# Patient Record
Sex: Female | Born: 1995 | Race: White | Hispanic: No | Marital: Single | Smoking: Never smoker
Health system: Southern US, Community
[De-identification: ages and names within clinical notes are randomized; demographics above are authoritative.]

## PROBLEM LIST (undated history)

## (undated) DIAGNOSIS — F32A Depression, unspecified: Secondary | ICD-10-CM

## (undated) DIAGNOSIS — R160 Hepatomegaly, not elsewhere classified: Secondary | ICD-10-CM

## (undated) DIAGNOSIS — H6641 Suppurative otitis media, unspecified, right ear: Secondary | ICD-10-CM

## (undated) DIAGNOSIS — M940 Chondrocostal junction syndrome [Tietze]: Secondary | ICD-10-CM

## (undated) DIAGNOSIS — J45909 Unspecified asthma, uncomplicated: Secondary | ICD-10-CM

## (undated) DIAGNOSIS — F419 Anxiety disorder, unspecified: Secondary | ICD-10-CM

## (undated) DIAGNOSIS — R51 Headache: Secondary | ICD-10-CM

## (undated) DIAGNOSIS — Z820 Family history of epilepsy and other diseases of the nervous system: Secondary | ICD-10-CM

## (undated) DIAGNOSIS — R519 Headache, unspecified: Secondary | ICD-10-CM

## (undated) DIAGNOSIS — N390 Urinary tract infection, site not specified: Secondary | ICD-10-CM

## (undated) DIAGNOSIS — B2790 Infectious mononucleosis, unspecified without complication: Secondary | ICD-10-CM

## (undated) DIAGNOSIS — N200 Calculus of kidney: Secondary | ICD-10-CM

## (undated) DIAGNOSIS — R109 Unspecified abdominal pain: Secondary | ICD-10-CM

## (undated) DIAGNOSIS — L309 Dermatitis, unspecified: Secondary | ICD-10-CM

## (undated) DIAGNOSIS — R634 Abnormal weight loss: Secondary | ICD-10-CM

## (undated) DIAGNOSIS — F329 Major depressive disorder, single episode, unspecified: Secondary | ICD-10-CM

## (undated) DIAGNOSIS — K5909 Other constipation: Secondary | ICD-10-CM

## (undated) HISTORY — DX: Other constipation: K59.09

## (undated) HISTORY — DX: Dermatitis, unspecified: L30.9

## (undated) HISTORY — DX: Major depressive disorder, single episode, unspecified: F32.9

## (undated) HISTORY — DX: Suppurative otitis media, unspecified, right ear: H66.41

## (undated) HISTORY — DX: Anxiety disorder, unspecified: F41.9

## (undated) HISTORY — DX: Unspecified abdominal pain: R10.9

## (undated) HISTORY — DX: Other disorders of bilirubin metabolism: E80.6

## (undated) HISTORY — DX: Calculus of kidney: N20.0

## (undated) HISTORY — DX: Urinary tract infection, site not specified: N39.0

## (undated) HISTORY — PX: EYE SURGERY: SHX253

## (undated) HISTORY — DX: Chondrocostal junction syndrome (tietze): M94.0

## (undated) HISTORY — DX: Abnormal weight loss: R63.4

## (undated) HISTORY — DX: Infectious mononucleosis, unspecified without complication: B27.90

## (undated) HISTORY — DX: Depression, unspecified: F32.A

## (undated) HISTORY — DX: Hepatomegaly, not elsewhere classified: R16.0

---
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
## 2015-07-26 ENCOUNTER — Encounter (HOSPITAL_COMMUNITY): Payer: Self-pay | Admitting: Emergency Medicine

## 2015-07-26 ENCOUNTER — Emergency Department (HOSPITAL_COMMUNITY)
Admission: EM | Admit: 2015-07-26 | Discharge: 2015-07-26 | Disposition: A | Discharge status: Home / Self Care | Payer: No Typology Code available for payment source | Attending: Emergency Medicine | Admitting: Emergency Medicine

## 2015-07-26 DIAGNOSIS — R112 Nausea with vomiting, unspecified: Secondary | ICD-10-CM | POA: Insufficient documentation

## 2015-07-26 DIAGNOSIS — R1030 Lower abdominal pain, unspecified: Secondary | ICD-10-CM | POA: Insufficient documentation

## 2015-07-26 DIAGNOSIS — R197 Diarrhea, unspecified: Secondary | ICD-10-CM | POA: Insufficient documentation

## 2015-07-26 NOTE — ED Notes (Signed)
Patient here with complaints of lower abd pain, nausea/vomiting, diarrhea. Hx of chronic constipation.

## 2015-11-03 ENCOUNTER — Encounter: Payer: Self-pay | Admitting: Pediatrics

## 2015-11-04 ENCOUNTER — Encounter: Payer: Self-pay | Admitting: Pediatrics

## 2016-05-29 IMAGING — CR DG NECK SOFT TISSUE
2 series · 2 of 2 positions shown · non-contrast
Comparison: Chest 05/12/2011

CLINICAL DATA: Sore throat and fever for 24 hr.

EXAM:
NECK SOFT TISSUES - 1+ VIEW

[w soft tissue neck lat]
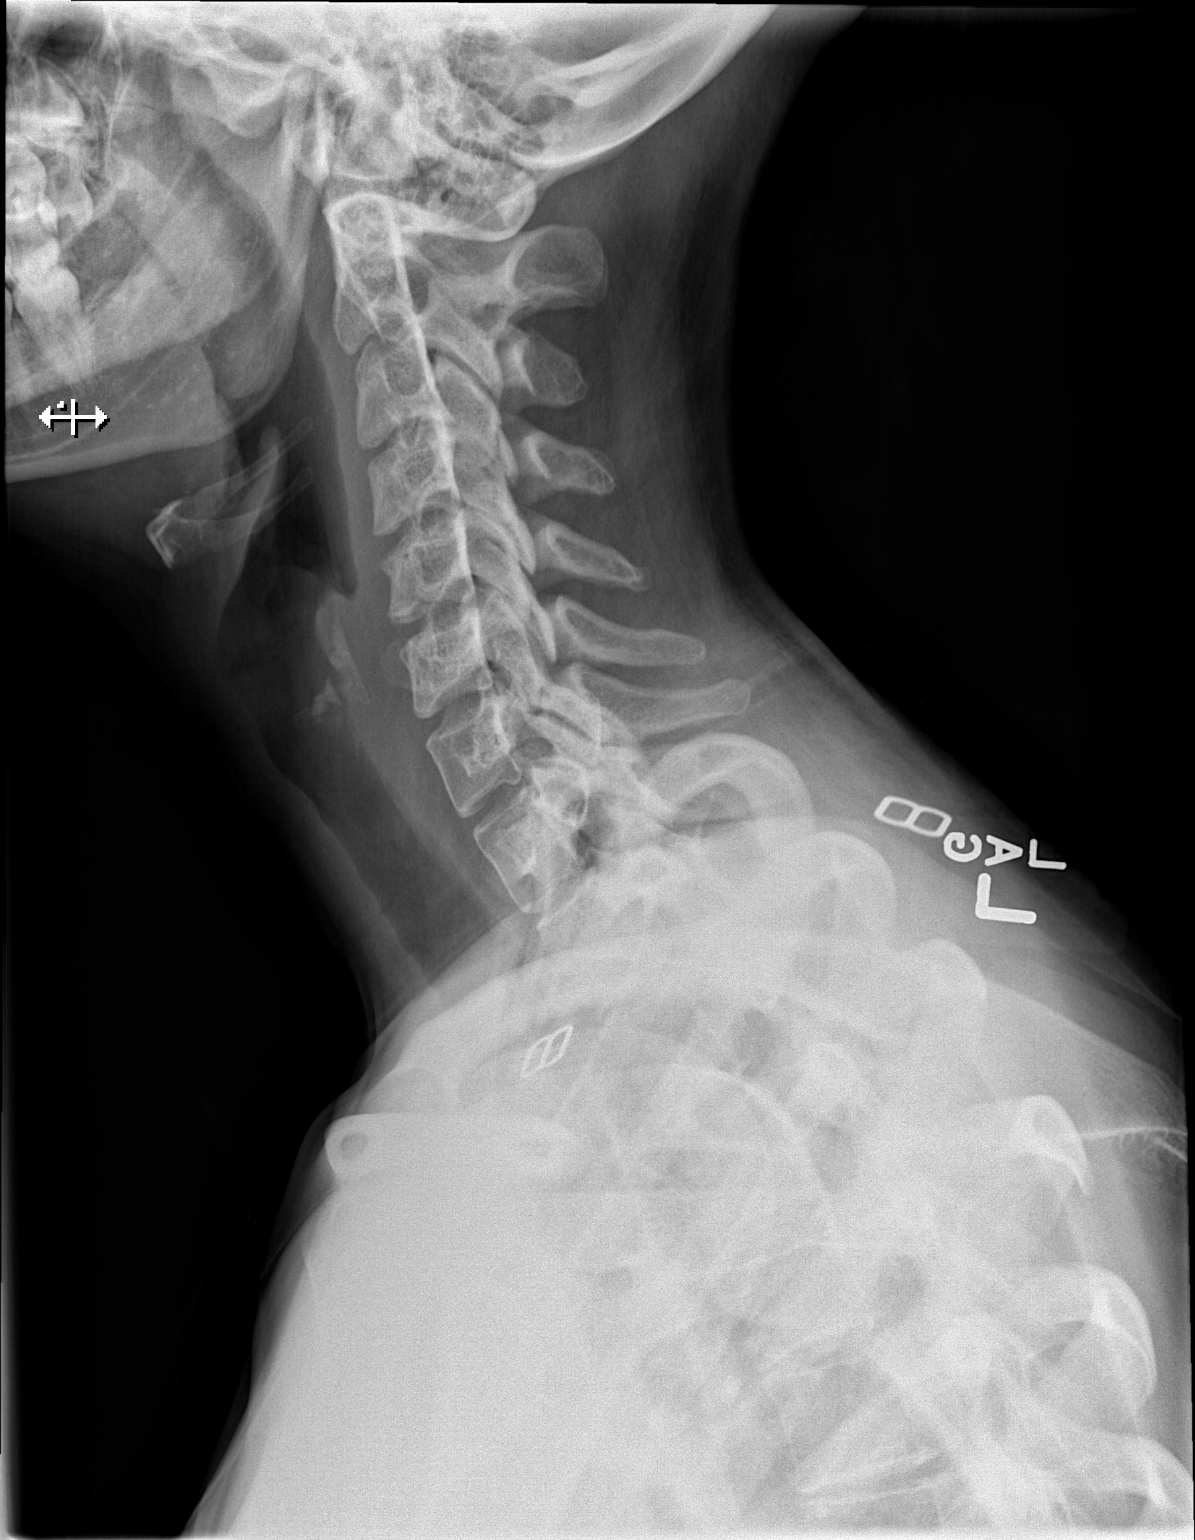

[w soft tissue neck ap]
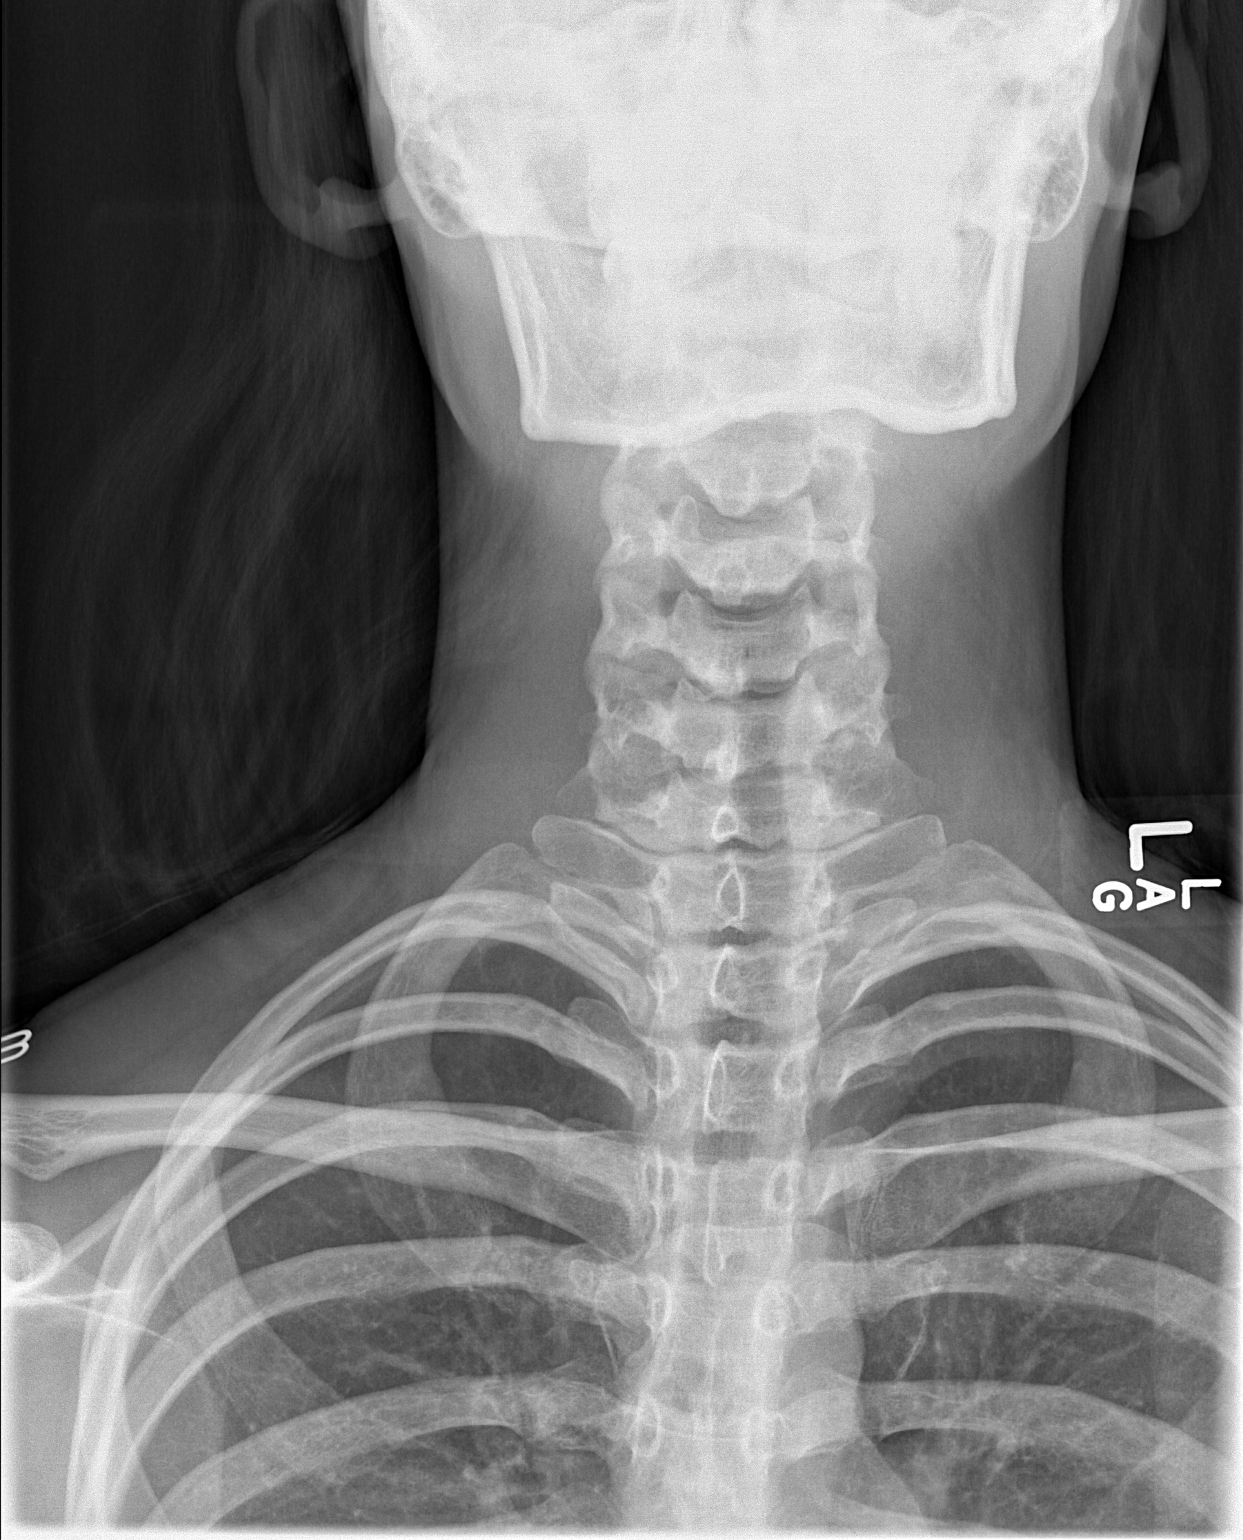

[2 of 2 positions shown; findings below may reference images not displayed]

FINDINGS: There is no evidence of retropharyngeal soft tissue swelling or
epiglottic enlargement. The cervical airway is unremarkable and no
radio-opaque foreign body identified.
IMPRESSION: Negative.

## 2019-04-11 NOTE — L&D Delivery Note (Signed)
Operative Delivery Note At 2:55 AM a viable female was delivered via Vaginal, [employer], operational).  Presentation: vertex; Position: Right,, Occiput,, Anterior; Station: +2.  Verbal consent: obtained from patient.  Risks and benefits discussed in detail.  Risks include, but are not limited to the risks of anesthesia, bleeding, infection, damage to maternal tissues, fetal cephalhematoma.  There is also the risk of inability to effect vaginal delivery of the head, or shoulder dystocia that cannot be resolved by established maneuvers, leading to the need for emergency cesarean section.  APGAR: 8, 9; weight  pending.   Placenta status: spontaneously with 3 vessel cord , .   Cord:  with the following complications:loose nuchal cord  .  Cord pH: not obtained  Anesthesia:  epidural Instruments: mushroom Episiotomy: None Lacerations: 2nd degree Suture Repair: 3.0 chromic Est. Blood Loss (mL):  300  Mom to postpartum.  Baby to Couplet care / Skin to Skin.  Jeani Hawking 01/21/2020, 3:09 AM

## 2019-09-23 LAB — OB RESULTS CONSOLE RUBELLA ANTIBODY, IGM: Rubella: IMMUNE

## 2019-09-23 LAB — OB RESULTS CONSOLE ANTIBODY SCREEN: Antibody Screen: NEGATIVE

## 2019-09-23 LAB — OB RESULTS CONSOLE GC/CHLAMYDIA
Chlamydia: NEGATIVE
Gonorrhea: NEGATIVE

## 2019-09-23 LAB — OB RESULTS CONSOLE ABO/RH: RH Type: POSITIVE

## 2019-09-23 LAB — OB RESULTS CONSOLE HIV ANTIBODY (ROUTINE TESTING): HIV: NONREACTIVE

## 2019-09-23 LAB — OB RESULTS CONSOLE HEPATITIS B SURFACE ANTIGEN: Hepatitis B Surface Ag: NEGATIVE

## 2019-12-31 LAB — OB RESULTS CONSOLE GBS: GBS: NEGATIVE

## 2020-01-14 ENCOUNTER — Ambulatory Visit (INDEPENDENT_AMBULATORY_CARE_PROVIDER_SITE_OTHER): Payer: Self-pay | Admitting: Pediatrics

## 2020-01-14 ENCOUNTER — Other Ambulatory Visit: Payer: Self-pay

## 2020-01-14 DIAGNOSIS — Z7681 Expectant parent(s) prebirth pediatrician visit: Secondary | ICD-10-CM

## 2020-01-15 ENCOUNTER — Encounter (HOSPITAL_COMMUNITY): Payer: Self-pay | Admitting: *Deleted

## 2020-01-15 ENCOUNTER — Telehealth (HOSPITAL_COMMUNITY): Payer: Self-pay | Admitting: *Deleted

## 2020-01-15 NOTE — Telephone Encounter (Signed)
Preadmission screen  

## 2020-01-17 ENCOUNTER — Other Ambulatory Visit (HOSPITAL_COMMUNITY)
Admission: RE | Admit: 2020-01-17 | Discharge: 2020-01-17 | Disposition: A | Discharge status: Home / Self Care | Payer: BC Managed Care – PPO | Source: Ambulatory Visit | Attending: Obstetrics and Gynecology | Admitting: Obstetrics and Gynecology

## 2020-01-17 DIAGNOSIS — Z01812 Encounter for preprocedural laboratory examination: Secondary | ICD-10-CM | POA: Insufficient documentation

## 2020-01-17 DIAGNOSIS — Z20822 Contact with and (suspected) exposure to covid-19: Secondary | ICD-10-CM | POA: Insufficient documentation

## 2020-01-17 LAB — SARS CORONAVIRUS 2 (TAT 6-24 HRS): SARS Coronavirus 2: NEGATIVE

## 2020-01-18 NOTE — Progress Notes (Signed)
Prenatal counseling for impending newborn done--  Reviewed current vaccine policy and answered all questions.  1st child, Currently 37 weeks, Current complications:  none, Z76.81

## 2020-01-20 ENCOUNTER — Other Ambulatory Visit: Payer: Self-pay

## 2020-01-20 ENCOUNTER — Inpatient Hospital Stay (HOSPITAL_COMMUNITY): Payer: BC Managed Care – PPO | Admitting: Anesthesiology

## 2020-01-20 ENCOUNTER — Inpatient Hospital Stay (HOSPITAL_COMMUNITY)
Admission: AD | Admit: 2020-01-20 | Discharge: 2020-01-23 | DRG: 807 | Disposition: A | Discharge status: Home / Self Care | Payer: BC Managed Care – PPO | Attending: Obstetrics and Gynecology | Admitting: Obstetrics and Gynecology

## 2020-01-20 ENCOUNTER — Encounter (HOSPITAL_COMMUNITY): Payer: Self-pay | Admitting: Obstetrics and Gynecology

## 2020-01-20 ENCOUNTER — Inpatient Hospital Stay (HOSPITAL_COMMUNITY): Payer: BC Managed Care – PPO

## 2020-01-20 DIAGNOSIS — Z3490 Encounter for supervision of normal pregnancy, unspecified, unspecified trimester: Secondary | ICD-10-CM

## 2020-01-20 DIAGNOSIS — Z3A39 39 weeks gestation of pregnancy: Secondary | ICD-10-CM | POA: Diagnosis not present

## 2020-01-20 DIAGNOSIS — Z01818 Encounter for other preprocedural examination: Secondary | ICD-10-CM | POA: Diagnosis not present

## 2020-01-20 DIAGNOSIS — Z23 Encounter for immunization: Secondary | ICD-10-CM | POA: Diagnosis not present

## 2020-01-20 DIAGNOSIS — O26893 Other specified pregnancy related conditions, third trimester: Secondary | ICD-10-CM | POA: Diagnosis present

## 2020-01-20 DIAGNOSIS — Z20822 Contact with and (suspected) exposure to covid-19: Secondary | ICD-10-CM | POA: Diagnosis present

## 2020-01-20 HISTORY — DX: Unspecified asthma, uncomplicated: J45.909

## 2020-01-20 LAB — CBC
HCT: 41.6 % (ref 36.0–46.0)
Hemoglobin: 14.4 g/dL (ref 12.0–15.0)
MCH: 31.8 pg (ref 26.0–34.0)
MCHC: 34.6 g/dL (ref 30.0–36.0)
MCV: 91.8 fL (ref 80.0–100.0)
Platelets: 150 10*3/uL (ref 150–400)
RBC: 4.53 MIL/uL (ref 3.87–5.11)
RDW: 12.4 % (ref 11.5–15.5)
WBC: 6 10*3/uL (ref 4.0–10.5)
nRBC: 0 % (ref 0.0–0.2)

## 2020-01-20 LAB — TYPE AND SCREEN
ABO/RH(D): B POS
Antibody Screen: NEGATIVE

## 2020-01-20 LAB — RPR: RPR Ser Ql: NONREACTIVE

## 2020-01-20 MED ORDER — OXYTOCIN-SODIUM CHLORIDE 30-0.9 UT/500ML-% IV SOLN
2.5000 [IU]/h | INTRAVENOUS | Status: DC
  Administered 2020-01-21: 2.5 [IU]/h via INTRAVENOUS
  Filled 2020-01-20: qty 500

## 2020-01-20 MED ORDER — PHENYLEPHRINE 40 MCG/ML (10ML) SYRINGE FOR IV PUSH (FOR BLOOD PRESSURE SUPPORT): 80.0000 ug | PREFILLED_SYRINGE | INTRAVENOUS | Status: DC | PRN

## 2020-01-20 MED ORDER — FENTANYL-BUPIVACAINE-NACL 0.5-0.125-0.9 MG/250ML-% EP SOLN
12.0000 mL/h | EPIDURAL | Status: DC | PRN
  Filled 2020-01-20: qty 250

## 2020-01-20 MED ORDER — LACTATED RINGERS IV SOLN
500.0000 mL | Freq: Once | INTRAVENOUS | Status: AC
  Administered 2020-01-20: 500 mL via INTRAVENOUS

## 2020-01-20 MED ORDER — DIPHENHYDRAMINE HCL 50 MG/ML IJ SOLN: 12.5000 mg | INTRAMUSCULAR | Status: DC | PRN

## 2020-01-20 MED ORDER — OXYTOCIN-SODIUM CHLORIDE 30-0.9 UT/500ML-% IV SOLN
1.0000 m[IU]/min | INTRAVENOUS | Status: DC
  Administered 2020-01-20: 2 m[IU]/min via INTRAVENOUS
  Filled 2020-01-20: qty 500

## 2020-01-20 MED ORDER — OXYCODONE-ACETAMINOPHEN 5-325 MG PO TABS: 1.0000 | ORAL_TABLET | ORAL | Status: DC | PRN

## 2020-01-20 MED ORDER — EPHEDRINE 5 MG/ML INJ: 10.0000 mg | INTRAVENOUS | Status: DC | PRN

## 2020-01-20 MED ORDER — LIDOCAINE HCL (PF) 1 % IJ SOLN
INTRAMUSCULAR | Status: DC | PRN
  Administered 2020-01-20: 6 mL via EPIDURAL

## 2020-01-20 MED ORDER — ONDANSETRON HCL 4 MG/2ML IJ SOLN: 4.0000 mg | Freq: Four times a day (QID) | INTRAMUSCULAR | Status: DC | PRN

## 2020-01-20 MED ORDER — LACTATED RINGERS IV SOLN: INTRAVENOUS | Status: DC

## 2020-01-20 MED ORDER — MISOPROSTOL 25 MCG QUARTER TABLET: 25.0000 ug | ORAL_TABLET | ORAL | Status: DC | PRN

## 2020-01-20 MED ORDER — LACTATED RINGERS IV SOLN: 500.0000 mL | INTRAVENOUS | Status: DC | PRN

## 2020-01-20 MED ORDER — OXYCODONE-ACETAMINOPHEN 5-325 MG PO TABS: 2.0000 | ORAL_TABLET | ORAL | Status: DC | PRN

## 2020-01-20 MED ORDER — FENTANYL-BUPIVACAINE-NACL 0.5-0.125-0.9 MG/250ML-% EP SOLN: 12.0000 mL/h | EPIDURAL | Status: DC | PRN

## 2020-01-20 MED ORDER — TERBUTALINE SULFATE 1 MG/ML IJ SOLN: 0.2500 mg | Freq: Once | INTRAMUSCULAR | Status: DC | PRN

## 2020-01-20 MED ORDER — BUPIVACAINE HCL (PF) 0.75 % IJ SOLN
INTRAMUSCULAR | Status: DC | PRN
Start: 2020-01-20 — End: 2020-01-21
  Administered 2020-01-20: 12 mL/h via EPIDURAL

## 2020-01-20 MED ORDER — OXYTOCIN BOLUS FROM INFUSION
333.0000 mL | Freq: Once | INTRAVENOUS | Status: AC
  Administered 2020-01-21: 333 mL via INTRAVENOUS

## 2020-01-20 MED ORDER — LIDOCAINE HCL (PF) 1 % IJ SOLN: 30.0000 mL | INTRAMUSCULAR | Status: DC | PRN

## 2020-01-20 MED ORDER — SOD CITRATE-CITRIC ACID 500-334 MG/5ML PO SOLN: 30.0000 mL | ORAL | Status: DC | PRN

## 2020-01-20 MED ORDER — ACETAMINOPHEN 325 MG PO TABS: 650.0000 mg | ORAL_TABLET | ORAL | Status: DC | PRN

## 2020-01-20 NOTE — Anesthesia Preprocedure Evaluation (Signed)
Anesthesia Evaluation  Patient identified by MRN, date of birth, ID band Patient awake    Reviewed: Allergy & Precautions, H&P , NPO status , Patient's Chart, lab work & pertinent test results, reviewed documented beta blocker date and time   Airway Mallampati: II  TM Distance: >3 FB Neck ROM: full    Dental no notable dental hx. (+) Teeth Intact, Dental Advisory Given   Pulmonary neg pulmonary ROS,    Pulmonary exam normal breath sounds clear to auscultation       Cardiovascular negative cardio ROS Normal cardiovascular exam Rhythm:regular Rate:Normal     Neuro/Psych  Headaches, PSYCHIATRIC DISORDERS Anxiety Depression    GI/Hepatic negative GI ROS, Neg liver ROS,   Endo/Other  negative endocrine ROS  Renal/GU negative Renal ROS  negative genitourinary   Musculoskeletal   Abdominal   Peds  Hematology negative hematology ROS (+)   Anesthesia Other Findings   Reproductive/Obstetrics (+) Pregnancy                             Anesthesia Physical Anesthesia Plan  ASA: II  Anesthesia Plan: Epidural   Post-op Pain Management:    Induction:   PONV Risk Score and Plan:   Airway Management Planned: Natural Airway  Additional Equipment: None  Intra-op Plan:   Post-operative Plan:   Informed Consent: I have reviewed the patients History and Physical, chart, labs and discussed the procedure including the risks, benefits and alternatives for the proposed anesthesia with the patient or authorized representative who has indicated his/her understanding and acceptance.     Dental Advisory Given  Plan Discussed with: Anesthesiologist  Anesthesia Plan Comments: (Labs checked- platelets confirmed with RN in room. Fetal heart tracing, per RN, reported to be stable enough for sitting procedure. Discussed epidural, and patient consents to the procedure:  included risk of possible  headache,backache, failed block, allergic reaction, and nerve injury. This patient was asked if she had any questions or concerns before the procedure started.)        Anesthesia Quick Evaluation

## 2020-01-20 NOTE — H&P (Signed)
Kimberly Richard is a 24 y.o. G 1 P 0 at 39 weeks presents for elective IOL .  GBS negative   OB History    Gravida  1   Para      Term      Preterm      AB      Living        SAB      TAB      Ectopic      Multiple      Live Births             Past Medical History:  Diagnosis Date  . Abdominal pain, recurrent   . Anxiety   . Constipation, chronic   . Costochondritis   . Depression    Previous trial of Zoloft but did not see improvement  . Eczema    Previously treated with Lidex  . Family history of migraine headaches   . Generalized headaches   . Hepatomegaly    Saw Dr. Chestine Spore for hepatomegaly & fatigue 04/2011  . Hyperbilirubinemia 03/13/2013   Noted during ER visit for severe tonsillopharyngitis.  Will need repeat.   . Infectious mononucleosis 05/13/2013  . Kidney stone on right side 06/29/2011  . Suppurative otitis media of right ear without rupture of tympanic membrane 01/29/2014  . UTI (urinary tract infection) 08/13/2012   Previous UTI with enterococcus pansensitive 06/1012.  Another UTI 08/13/12, ecoli, pansensitive, Cipro 250 BID x 3 days.   . Weight loss 06/14/2011   TTG & IgA neg.  CBC wnl.  CMP wnl.  TSH/FT4 wnl.  HIV neg.  CRP wnl.    Past Surgical History:  Procedure Laterality Date  . EYE SURGERY     Family History: family history includes Hyperlipidemia in her mother; Hypertension in her brother and mother; Urolithiasis in her father. Social History:  reports that she has never smoked. She has never used smokeless tobacco. She reports that she does not drink alcohol and does not use drugs.     Maternal Diabetes: No Genetic Screening: Normal Maternal Ultrasounds/Referrals: Normal Fetal Ultrasounds or other Referrals:  None Maternal Substance Abuse:  No Significant Maternal Medications:  None Significant Maternal Lab Results:  Group B Strep negative Other Comments:  None  Review of Systems  All other systems reviewed and are  negative.  Maternal Medical History:  Fetal activity: Perceived fetal activity is normal.    Prenatal complications: no prenatal complications     There were no vitals taken for this visit. Maternal Exam:  Uterine Assessment: Contraction strength is mild.  Contraction frequency is regular.   Abdomen: Fetal presentation: vertex  Introitus: Normal vulva.   Fetal Exam Fetal State Assessment: Category I - tracings are normal.     Physical Exam Vitals and nursing note reviewed. Exam conducted with a chaperone present.  Constitutional:      Appearance: Normal appearance.  Eyes:     Pupils: Pupils are equal, round, and reactive to light.  Cardiovascular:     Rate and Rhythm: Normal rate and regular rhythm.  Pulmonary:     Effort: Pulmonary effort is normal.  Abdominal:     General: Abdomen is flat.  Genitourinary:    General: Normal vulva.  Musculoskeletal:     Cervical back: Normal range of motion.  Neurological:     Mental Status: She is alert.     Prenatal labs: ABO, Rh:   Antibody:   Rubella:   RPR:    HBsAg:  HIV:    GBS:     Assessment/Plan: IUP at 39 weeks Elective IOL AROM after IV and pitocin prn protocol EPIDURAL prn Plan reviewed with patient   Jeani Hawking 01/20/2020, 8:34 AM

## 2020-01-20 NOTE — Anesthesia Procedure Notes (Signed)
Epidural Patient location during procedure: OB Start time: 01/20/2020 10:57 AM End time: 01/20/2020 11:00 AM  Staffing Anesthesiologist: Bethena Midget, MD  Preanesthetic Checklist Completed: patient identified, IV checked, site marked, risks and benefits discussed, surgical consent, monitors and equipment checked, pre-op evaluation and timeout performed  Epidural Patient position: sitting Prep: DuraPrep and site prepped and draped Patient monitoring: continuous pulse ox and blood pressure Approach: midline Location: L3-L4 Injection technique: LOR air  Needle:  Needle type: Tuohy  Needle gauge: 17 G Needle length: 9 cm and 9 Needle insertion depth: 4 cm Catheter type: closed end flexible Catheter size: 19 Gauge Catheter at skin depth: 9 cm Test dose: negative  Assessment Events: blood not aspirated, injection not painful, no injection resistance, no paresthesia and negative IV test

## 2020-01-21 ENCOUNTER — Encounter (HOSPITAL_COMMUNITY): Payer: Self-pay | Admitting: Obstetrics and Gynecology

## 2020-01-21 LAB — CBC
HCT: 34.6 % — ABNORMAL LOW (ref 36.0–46.0)
Hemoglobin: 12.2 g/dL (ref 12.0–15.0)
MCH: 32.5 pg (ref 26.0–34.0)
MCHC: 35.3 g/dL (ref 30.0–36.0)
MCV: 92.3 fL (ref 80.0–100.0)
Platelets: 132 10*3/uL — ABNORMAL LOW (ref 150–400)
RBC: 3.75 MIL/uL — ABNORMAL LOW (ref 3.87–5.11)
RDW: 12.4 % (ref 11.5–15.5)
WBC: 12.5 10*3/uL — ABNORMAL HIGH (ref 4.0–10.5)
nRBC: 0 % (ref 0.0–0.2)

## 2020-01-21 MED ORDER — PRENATAL MULTIVITAMIN CH
1.0000 | ORAL_TABLET | Freq: Every day | ORAL | Status: DC
  Administered 2020-01-21 – 2020-01-22 (×2): 1 via ORAL
  Filled 2020-01-21 (×2): qty 1

## 2020-01-21 MED ORDER — DIPHENHYDRAMINE HCL 25 MG PO CAPS: 25.0000 mg | ORAL_CAPSULE | Freq: Four times a day (QID) | ORAL | Status: DC | PRN

## 2020-01-21 MED ORDER — COCONUT OIL OIL: 1.0000 "application " | TOPICAL_OIL | Status: DC | PRN

## 2020-01-21 MED ORDER — ACETAMINOPHEN 325 MG PO TABS: 650.0000 mg | ORAL_TABLET | ORAL | Status: DC | PRN

## 2020-01-21 MED ORDER — WITCH HAZEL-GLYCERIN EX PADS: 1.0000 "application " | MEDICATED_PAD | CUTANEOUS | Status: DC | PRN

## 2020-01-21 MED ORDER — SIMETHICONE 80 MG PO CHEW: 80.0000 mg | CHEWABLE_TABLET | ORAL | Status: DC | PRN

## 2020-01-21 MED ORDER — SENNOSIDES-DOCUSATE SODIUM 8.6-50 MG PO TABS
2.0000 | ORAL_TABLET | ORAL | Status: DC
  Administered 2020-01-21: 2 via ORAL
  Filled 2020-01-21: qty 2

## 2020-01-21 MED ORDER — BENZOCAINE-MENTHOL 20-0.5 % EX AERO
1.0000 "application " | INHALATION_SPRAY | CUTANEOUS | Status: DC | PRN
  Filled 2020-01-21: qty 56

## 2020-01-21 MED ORDER — ZOLPIDEM TARTRATE 5 MG PO TABS: 5.0000 mg | ORAL_TABLET | Freq: Every evening | ORAL | Status: DC | PRN

## 2020-01-21 MED ORDER — MEDROXYPROGESTERONE ACETATE 150 MG/ML IM SUSP: 150.0000 mg | INTRAMUSCULAR | Status: DC | PRN

## 2020-01-21 MED ORDER — ONDANSETRON HCL 4 MG PO TABS: 4.0000 mg | ORAL_TABLET | ORAL | Status: DC | PRN

## 2020-01-21 MED ORDER — MEASLES, MUMPS & RUBELLA VAC IJ SOLR: 0.5000 mL | Freq: Once | INTRAMUSCULAR | Status: DC

## 2020-01-21 MED ORDER — FLEET ENEMA 7-19 GM/118ML RE ENEM: 1.0000 | ENEMA | Freq: Every day | RECTAL | Status: DC | PRN

## 2020-01-21 MED ORDER — TETANUS-DIPHTH-ACELL PERTUSSIS 5-2.5-18.5 LF-MCG/0.5 IM SUSP: 0.5000 mL | Freq: Once | INTRAMUSCULAR | Status: DC

## 2020-01-21 MED ORDER — DIBUCAINE (PERIANAL) 1 % EX OINT: 1.0000 "application " | TOPICAL_OINTMENT | CUTANEOUS | Status: DC | PRN

## 2020-01-21 MED ORDER — INFLUENZA VAC SPLIT QUAD 0.5 ML IM SUSY
0.5000 mL | PREFILLED_SYRINGE | INTRAMUSCULAR | Status: AC
  Administered 2020-01-23: 0.5 mL via INTRAMUSCULAR
  Filled 2020-01-21: qty 0.5

## 2020-01-21 MED ORDER — ONDANSETRON HCL 4 MG/2ML IJ SOLN: 4.0000 mg | INTRAMUSCULAR | Status: DC | PRN

## 2020-01-21 MED ORDER — BISACODYL 10 MG RE SUPP
10.0000 mg | Freq: Every day | RECTAL | Status: DC | PRN
  Filled 2020-01-21: qty 1

## 2020-01-21 MED ORDER — IBUPROFEN 600 MG PO TABS
600.0000 mg | ORAL_TABLET | Freq: Four times a day (QID) | ORAL | Status: DC
  Administered 2020-01-21 – 2020-01-23 (×9): 600 mg via ORAL
  Filled 2020-01-21 (×9): qty 1

## 2020-01-21 NOTE — Anesthesia Postprocedure Evaluation (Signed)
Anesthesia Post Note  Patient: Kimberly Richard  Procedure(s) Performed: AN AD HOC LABOR EPIDURAL     Patient location during evaluation: Mother Baby Anesthesia Type: Epidural Level of consciousness: awake and alert Pain management: pain level controlled Vital Signs Assessment: post-procedure vital signs reviewed and stable Respiratory status: spontaneous breathing Cardiovascular status: blood pressure returned to baseline Postop Assessment: no headache, adequate PO intake, no backache, patient able to bend at knees, able to ambulate, epidural receding and no apparent nausea or vomiting Anesthetic complications: no   No complications documented.  Last Vitals:  Vitals:   01/21/20 0601 01/21/20 0819  BP: (!) 135/104 129/86  Pulse: 96 95  Resp: 18   Temp: 37.2 C 36.8 C    Last Pain:  Vitals:   01/21/20 0819  TempSrc: Oral  PainSc: 0-No pain   Pain Goal:                Epidural/Spinal Function Cutaneous sensation: Normal sensation (01/21/20 0819), Patient able to flex knees: Yes (01/21/20 0819), Patient able to lift hips off bed: Yes (01/21/20 0819), Back pain beyond tenderness at insertion site: No (01/21/20 0819), Progressively worsening motor and/or sensory loss: No (01/21/20 0819)  Salome Arnt

## 2020-01-21 NOTE — Lactation Note (Signed)
This note was copied from a baby's chart. Lactation Consultation Note  Patient Name: Kimberly Richard [id number] Date: 01/21/2020 Reason for consult: Initial assessment;Term  Initial visit to 73 hours old infant of P1 mother. Infant is sleeping in basinet upon arrival. Mother stated infant is very spitty and gaggy. Mother reported she tried to latch recently but infant is not interested.  Attempted latch, football hold to left breast without success. Placed infant skin to skin. Encourage to follow babies' hunger and fullness cues. Reviewed importance to offer the breast 8 to 12 times in a 24-hour period for proper stimulation and to establish good milk supply.     Reviewed with mother average size of a NB stomach. Reviewed colostrum benefits for baby. Reviewed breastfeeding basics. Discussed milk coming to volume. Reviewed newborn behavior, feeding patterns and expectations with mother. Encouraged to contact [facility] for support when ready to breastfeed baby and recommended to request help for questions or concerns.    Maternal Data Formula Feeding for Exclusion: No Has patient been taught Hand Expression?: Yes Does the patient have breastfeeding experience prior to this delivery?: No  Feeding Feeding Type: Breast Fed  LATCH Score Latch: Too sleepy or reluctant, no latch achieved, no sucking elicited.  Audible Swallowing: None  Type of Nipple: Everted at rest and after stimulation  Comfort (Breast/Nipple): Soft / non-tender  Hold (Positioning): Assistance needed to correctly position infant at breast and maintain latch.  LATCH Score: 5  Interventions Interventions: Breast feeding basics reviewed;Assisted with latch;Skin to skin;Breast massage;Hand express;Adjust position;Support pillows;Position options;Expressed milk  Consult Status Consult Status: Follow-up Date: 01/22/20 Follow-up type: In-patient    Noretta Frier A Higuera Ancidey 01/21/2020, 7:27 PM

## 2020-01-22 ENCOUNTER — Inpatient Hospital Stay: Payer: BC Managed Care – PPO

## 2020-01-22 DIAGNOSIS — Z23 Encounter for immunization: Secondary | ICD-10-CM

## 2020-01-22 NOTE — Progress Notes (Signed)
Post Partum Day 1 Subjective: no complaints, up ad lib, voiding and tolerating PO.  Patient desires circ but nursery reports poor feeding.  Objective: Blood pressure (!) 138/95, pulse 71, temperature 98.4 F (36.9 C), temperature source Oral, resp. rate 18, SpO2 100 %, unknown if currently breastfeeding.  Physical Exam:  General: alert, cooperative and appears stated age Lochia: appropriate Uterine Fundus: firm Incision: healing well, no significant drainage, no dehiscence DVT Evaluation: No evidence of DVT seen on physical exam. Negative Homan's sign. No cords or calf tenderness.  Recent Labs    01/20/20 0826 01/21/20 0533  HGB 14.4 12.2  HCT 41.6 34.6*    Assessment/Plan: Plan for discharge tomorrow and Breastfeeding Plan for circ tomorrow prior to d/c   LOS: 2 days   Mitchel Honour 01/22/2020, 1:06 PM

## 2020-01-22 NOTE — Progress Notes (Signed)
Pt. Received 1st dose Moderna, consent given by Dr. Mitchel Honour  Covid-19 Vaccination Clinic  Name:  Kimberly Richard    MRN: [id number] DOB: 1995/05/27  01/22/2020  Ms. Buccheri was observed post Covid-19 immunization for 15 minutes without incident. She was provided with Vaccine Information Sheet and instruction to access the V-Safe system.   Ms. Whitenack was instructed to call 911 with any severe reactions post vaccine: Marland Kitchen Difficulty breathing  . Swelling of face and throat  . A fast heartbeat  . A bad rash all over body  . Dizziness and weakness   Immunizations Administered    Name Date Dose VIS Date Route   Moderna COVID-19 Vaccine 01/22/2020 12:04 PM 0.5 mL 03/2019 Intramuscular   Manufacturer: Moderna   Lot: [id number]   NDC: 25852-778-24

## 2020-01-22 NOTE — Lactation Note (Signed)
This note was copied from a baby's chart. Lactation Consultation Note  Patient Name: Kimberly Richard Date: 01/22/2020 Reason for consult: Follow-up assessment  LC Follow Up Visit:  Mother requested latch assistance.  Baby awake but not showing feeding cues when I arrived.  Mother demonstrated hand expression and a few drops of colostrum expressed and finger fed back to baby.  Suck training performed.  Attempted to latch to the right breast in the cross cradle hold.  Baby was able to latch but became fussy at the breast and pushed back.  Attempted a couple more times with the same result.  Burped baby and tried in the football hold on the same breast.  Again, baby would latch but pushed back and was not interested in sucking.  Asked mother to express more colostrum drops which I finger fed back to baby.    Offered to provide a manual pump for mother to help evert nipples and obtain colostrum for supplementation.  Demonstrated pump and parts; mother did a return demonstration of pump assembly.  Provided colostrum container and left mother doing hand expression and collecting colostrum.  She will feed back any EBM she obtains to baby.  Suggested she latch again when baby shows feeding cues.  Mother appreciative.  Reassured her that breast feeding takes patience and practice.  Praised her for improving her hand expression technique and desire to breast feed.  Father present.   Maternal Data    Feeding Feeding Type: Breast Fed  LATCH Score Latch: Too sleepy or reluctant, no latch achieved, no sucking elicited.  Audible Swallowing: None  Type of Nipple: Everted at rest and after stimulation (short shafted)  Comfort (Breast/Nipple): Soft / non-tender  Hold (Positioning): Assistance needed to correctly position infant at breast and maintain latch.  LATCH Score: 5  Interventions Interventions: Breast feeding basics reviewed;Assisted with latch;Skin to skin;Hand express;Breast  massage;Breast compression;Adjust position;Hand pump;Position options;Support pillows  Lactation Tools Discussed/Used     Consult Status Consult Status: Follow-up Date: 01/23/20 Follow-up type: In-patient    Dora Sims 01/22/2020, 12:47 PM

## 2020-01-22 NOTE — Lactation Note (Signed)
This note was copied from a baby's chart. Lactation Consultation Note  Patient Name: Boy Allie Ousley YDXAJ'O Date: 01/22/2020 Reason for consult: Follow-up assessment;Term;Primapara;1st time breastfeeding  P1 mother whose infant is now 66 hours old.  This is a term baby at 39+1 weeks.  RN requested latch assistance.  Baby was STS on mother's chest when I arrived.  Mother stated she had breast fed for approximately 10 minutes.  She feels like he is starting to latch better and denies pain with latching.  Discussed observing and feeding with cues.  Encouraged continued hand expression before/after feedings to help establish a good milk supply.  Mother verbalized understanding.  Suggested mother call her RN/LC for latch assistance as needed throughout the day.  She has a DEBP for home use.  RN updated.  No support person present at this time.   Maternal Data    Feeding Feeding Type: Breast Fed  LATCH Score Latch: Grasps breast easily, tongue down, lips flanged, rhythmical sucking.  Audible Swallowing: A few with stimulation  Type of Nipple: Everted at rest and after stimulation  Comfort (Breast/Nipple): Soft / non-tender  Hold (Positioning): Assistance needed to correctly position infant at breast and maintain latch.  LATCH Score: 8  Interventions Interventions: Skin to skin;Breast feeding basics reviewed;Assisted with latch;Hand express;Breast massage;Adjust position;Support pillows  Lactation Tools Discussed/Used     Consult Status Consult Status: Follow-up Date: 01/23/20 Follow-up type: In-patient    Elyzabeth Goatley R Jaskirat Zertuche 01/22/2020, 9:29 AM

## 2020-01-23 LAB — RUBELLA SCREEN: Rubella: 1.37 index (ref >0.99)

## 2020-01-23 NOTE — Discharge Summary (Signed)
Postpartum Discharge Summary      Patient Name: Kimberly Richard DOB: 05/31/1995 MRN: [id number]  Date of admission: 01/20/2020 Delivery date:01/21/2020  Delivering provider: Dian Queen  Date of discharge: 01/23/2020  Admitting diagnosis: Pregnancy [Z34.90] Vacuum extractor delivery, delivered [O75.9] Intrauterine pregnancy: [redacted]w[redacted]d     Secondary diagnosis:  Active Problems:   Pregnancy   Vacuum extractor delivery, delivered  Additional problems:      Discharge diagnosis: Term Pregnancy Delivered                                              Post partum procedures:  Augmentation: AROM and Pitocin Complications: None  Hospital course: Induction of Labor With Vaginal Delivery   [name] was admitted to the hospital 01/20/2020 for induction of labor.  Indication for induction: Elective.  Patient had an uncomplicated labor course as follows: Membrane Rupture Time/Date: 8:45 AM ,01/20/2020   Delivery Method:Vaginal, Vacuum (Extractor)  Episiotomy: None  Lacerations:  2nd degree  Details of delivery can be found in separate delivery note.  Patient had a routine postpartum course. Patient is discharged home 01/23/20.  Newborn Data: Birth date:01/21/2020  Birth time:2:55 AM  Gender:Female  Living status:Living  Apgars:8 ,9  Weight:3201 g   Magnesium Sulfate received: No BMZ received: No Rhophylac:N/A MMR:N/A T-DaP:Given prenatally Flu: N/A Transfusion:No  Physical exam  Vitals:   01/22/20 0536 01/22/20 1503 01/22/20 2026 01/23/20 0528  BP: (!) 138/95 124/83 136/90 (!) 136/91  Pulse: 71 80 81 86  Resp: $Remo'18 17 18 18  'kVowO$ Temp: 98.4 F (36.9 C) 98.1 F (36.7 C) 98.2 F (36.8 C) 98.4 F (36.9 C)  TempSrc: Oral Oral Oral Tympanic  SpO2:  100% 100%    General: alert, cooperative and no distress Lochia: appropriate Uterine Fundus: firm Incision:   DVT Evaluation: No evidence of DVT seen on physical exam. Labs: Lab Results  Component Value  Date   WBC 12.5 (H) 01/21/2020   HGB 12.2 01/21/2020   HCT 34.6 (L) 01/21/2020   MCV 92.3 01/21/2020   PLT 132 (L) 01/21/2020   CMP Latest Ref Rng & Units 04/13/2014  Glucose 70 - 99 mg/dL 65(L)  BUN 6 - 23 mg/dL 11  Creatinine 0.50 - 1.10 mg/dL 0.70  Sodium 135 - 145 mEq/L 142  Potassium 3.5 - 5.3 mEq/L 4.6  Chloride 96 - 112 mEq/L 105  CO2 19 - 32 mEq/L 26  Calcium 8.4 - 10.5 mg/dL 9.5  Total Protein 6.0 - 8.3 g/dL 6.5  Total Bilirubin 0.2 - 1.1 mg/dL 0.9  Alkaline Phos 39 - 117 U/L 61  AST 0 - 37 U/L 12  ALT 0 - 35 U/L 8   Edinburgh Score: Edinburgh Postnatal Depression Scale Screening Tool 01/22/2020  I have been able to laugh and see the funny side of things. 0  I have looked forward with enjoyment to things. 0  I have blamed myself unnecessarily when things went wrong. 0  I have been anxious or worried for no good reason. 0  I have felt scared or panicky for no good reason. 0  Things have been getting on top of me. 0  I have been so unhappy that I have had difficulty sleeping. 0  I have felt sad or miserable. 0  I have been so unhappy that I have been crying. 0  The thought of harming myself has occurred to me. 0  Edinburgh Postnatal Depression Scale Total 0      After visit meds:  Allergies as of 01/23/2020   No Known Allergies     Medication List    TAKE these medications   fluticasone 50 MCG/ACT nasal spray Commonly known as: FLONASE Place 2 sprays into both nostrils daily. 1 spray in each nostril every day   hydrocortisone 2.5 % ointment Apply topically 2 (two) times daily. As needed for mild eczema.  Do not use for more than 1-2 weeks at a time.   ibuprofen 400 MG tablet Commonly known as: ADVIL Take 1 tablet (400 mg total) by mouth every 6 (six) hours as needed.        Discharge home in stable condition Infant Feeding: Breast Infant Disposition:home with mother Discharge instruction: per After Visit Summary and Postpartum booklet. Activity:  Advance as tolerated. Pelvic rest for 6 weeks.  Diet: routine diet Anticipated Birth Control: Unsure Postpartum Appointment:6 weeks Additional Postpartum F/U:   Future Appointments:No future appointments. Follow up Visit:      01/23/2020 Kimberly Lex, MD

## 2020-01-23 NOTE — Lactation Note (Signed)
This note was copied from a baby's chart. Lactation Consultation Note  Patient Name: Boy Amariana Mirando VOPFY'T Date: 01/23/2020 Reason for consult: Follow-up assessment;Primapara  "Clydene Pugh" is 17 hrs old. Mom began supplementing with formula last night & reports that it is going well. Mom says she is offering breast first, but infant falls asleep quickly. Mom declines an outpatient lactation visit at this time.  I showed Mom how to hold infant in a different manner with the next bottle feeding. I encouraged Mom to pump whenever infant receives formula. Mom will be getting a pump (unsure of which brand) from her sister/sister-in-law. I demonstrated how to separate pump parts & reassemble for purposes of washing. Mom has a hand pump from the hospital.  Mom's breasts are somewhat tubular (but not widely-spaced), with her L breast larger than her R. The areola on her L breast is much larger than her R breast. Mom does report an increase in cup size with pregnancy. The veining on Mom's breasts suggest that she will have at least an adequate milk supply.   Parents know how to reach [place] for post-discharge questions.  Lurline Hare [facility] 01/23/2020, 8:29 AM

## 2021-10-21 ENCOUNTER — Encounter: Payer: Self-pay | Admitting: Urgent Care

## 2021-10-21 ENCOUNTER — Ambulatory Visit
Admission: EM | Admit: 2021-10-21 | Discharge: 2021-10-21 | Disposition: A | Discharge status: Home / Self Care | Payer: BC Managed Care – PPO

## 2021-10-21 DIAGNOSIS — M62830 Muscle spasm of back: Secondary | ICD-10-CM

## 2021-10-21 DIAGNOSIS — G5701 Lesion of sciatic nerve, right lower limb: Secondary | ICD-10-CM

## 2021-10-21 MED ORDER — DICLOFENAC SODIUM 75 MG PO TBEC: 75.0000 mg | DELAYED_RELEASE_TABLET | Freq: Two times a day (BID) | ORAL | 0 refills | Status: AC

## 2021-10-21 MED ORDER — METAXALONE 800 MG PO TABS: 800.0000 mg | ORAL_TABLET | Freq: Three times a day (TID) | ORAL | 0 refills | Status: DC

## 2021-10-21 MED ORDER — DICLOFENAC SODIUM 75 MG PO TBEC: 75.0000 mg | DELAYED_RELEASE_TABLET | Freq: Two times a day (BID) | ORAL | 0 refills | Status: DC

## 2021-10-21 MED ORDER — METAXALONE 800 MG PO TABS: 800.0000 mg | ORAL_TABLET | Freq: Three times a day (TID) | ORAL | 0 refills | Status: AC

## 2021-10-21 NOTE — ED Triage Notes (Signed)
Patient c/o back spasms on her right side that radiates down her right leg.  Patient has been able to rest for a couple of days, now up moving with work, pain has come back.  Has taken Tylenol which has helped.  No apparent injury.

## 2021-10-21 NOTE — Discharge Instructions (Signed)
Please start taking the muscle relaxer up to 3 times daily as tolerated.  Common side effects include sedation and drowsiness. Take the anti-inflammatory (diclofenac) up to twice daily with food.  Do not take any additional over-the-counter anti-inflammatory medications such as Motrin, Advil, Aleve, naproxen.  You may only take Tylenol if needed. Continue moist heat to your back, or a TENS unit. Do the attached rehab exercises. Follow-up with physical therapy should symptoms persist.

## 2021-10-21 NOTE — ED Provider Notes (Signed)
[facility]    CSN: [id number] Arrival date & time: 10/21/21  1651      History   Chief Complaint Chief Complaint  Patient presents with   Leg Pain    Sciatic nerve pain - Entered by patient    HPI Kimberly Richard is a 26 y.o. female.   Pleasant 26 year old female presents today with a 5-day history of right-sided lower back pain.  Kimberly Richard states on Monday Kimberly Richard was getting ready to go to work, and felt severe spasms to her back which dropped her to the floor.  Kimberly Richard reports intermittent low back pain since having her son almost 2 years ago.  States in the past Kimberly Richard has been able to get over it much faster, but this has continued.  Tuesday and Wednesday Kimberly Richard was able to work from home and rest.  Stated that her pain was improved on most days.  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  Kimberly Richard states that walking and standing seems to worsen her pain.  Kimberly Richard has been taking ibuprofen and Tylenol without significant resolution.  Kimberly Richard states the pain seems to go down the posterior aspect of her right leg between her buttock and her knee.  It does not affect her lower leg or her foot.  Kimberly Richard reports the pain is sharp, but denies paresthesias or numbness and tingling.  Kimberly Richard denies saddle anesthesia.  Kimberly Richard denies bowel or bladder incontinence.  Kimberly Richard denies any rash.  Kimberly Richard denies any injury to her lower back.  Kimberly Richard does have a TENS unit at home and used it with mild relief.   Leg Pain Associated symptoms: back pain     Past Medical History:  Diagnosis Date   Abdominal pain, recurrent    Anxiety    Asthma    Constipation, chronic    Costochondritis    Depression    Previous trial of Zoloft but did not see improvement   Eczema    Previously treated with Lidex   Family history of migraine headaches    Generalized headaches    Hepatomegaly    Saw Dr. Chestine Spore for hepatomegaly & fatigue 04/2011   Hyperbilirubinemia 03/13/2013   Noted during ER visit for severe  tonsillopharyngitis.  Will need repeat.    Infectious mononucleosis 05/13/2013   Kidney stone on right side 06/29/2011   Suppurative otitis media of right ear without rupture of tympanic membrane 01/29/2014   UTI (urinary tract infection) 08/13/2012   Previous UTI with enterococcus pansensitive 06/1012.  Another UTI 08/13/12, ecoli, pansensitive, Cipro 250 BID x 3 days.    Weight loss 06/14/2011   TTG & IgA neg.  CBC wnl.  CMP wnl.  TSH/FT4 wnl.  HIV neg.  CRP wnl.     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 Authorizing Provider  paragard intrauterine copper IUD IUD 1 each by Intrauterine route once.   Yes [provider]  diclofenac (VOLTAREN) 75 MG EC tablet Take 1 tablet (75 mg total) by mouth 2 (two) times daily with a meal. 10/21/21   Lashawne Dura L, PA  metaxalone (SKELAXIN) 800 MG tablet Take 1 tablet (800 mg total) by mouth 3 (three) times daily for 10 days. 10/21/21 10/31/21  Maretta Bees, PA    Family History Family History  Problem Relation Age of Onset   Hypertension Mother    Hyperlipidemia Mother    Urolithiasis  Father    Hypertension Brother     Social History Social History   Tobacco Use   Smoking status: Never   Smokeless tobacco: Never  Vaping Use   Vaping Use: Never used  Substance Use Topics   Alcohol use: No   Drug use: No     Allergies   Patient has no known allergies.   Review of Systems Review of Systems  Musculoskeletal:  Positive for back pain and myalgias.     Physical Exam Triage Vital Signs ED Triage Vitals  Enc Vitals Group     BP 10/21/21 1710 (!) 136/93     Pulse Rate 10/21/21 1710 79     Resp 10/21/21 1710 18     Temp 10/21/21 1710 98.3 F (36.8 C)     Temp Source 10/21/21 1710 Oral     SpO2 10/21/21 1710 100 %     Weight 10/21/21 1711 120 lb (54.4 kg)     Height 10/21/21 1711 5\' 5"  (1.651 m)     Head Circumference --      Peak Flow --      Pain Score 10/21/21 1711 6     Pain Loc --      Pain Edu? --      Excl. in GC? --    No data found.  Updated Vital Signs BP (!) 136/93 (BP Location: Left Arm)   Pulse 79   Temp 98.3 F (36.8 C) (Oral)   Resp 18   Ht 5\' 5"  (1.651 m)   Wt 120 lb (54.4 kg)   SpO2 100%   Breastfeeding No   BMI 19.97 kg/m   Visual Acuity Right Eye Distance:   Left Eye Distance:   Bilateral Distance:    Right Eye Near:   Left Eye Near:    Bilateral Near:     Physical Exam Vitals and nursing note reviewed.  Constitutional:      General: Kimberly Richard is not in acute distress.    Appearance: Normal appearance. Kimberly Richard is obese. Kimberly Richard is not ill-appearing, toxic-appearing or diaphoretic.  HENT:     Head: Normocephalic and atraumatic.     Nose: Nose normal.     Mouth/Throat:     Mouth: Mucous membranes are moist.  Eyes:     Extraocular Movements: Extraocular movements intact.     Conjunctiva/sclera: Conjunctivae normal.     Pupils: Pupils  are equal, round, and reactive to light.  Cardiovascular:     Rate and Rhythm: Normal rate.  Pulmonary:     Effort: Pulmonary effort is normal. No respiratory distress.  Musculoskeletal:         General: Tenderness present. No swelling, deformity or signs of injury. Normal range of motion.     Cervical back: Normal range of motion and neck supple. No rigidity or tenderness.     Thoracic back: Normal. No spasms, tenderness or bony tenderness.     Lumbar back: Normal. No swelling, edema, deformity, signs of trauma, lacerations, spasms, tenderness or bony tenderness. Normal range of motion. Negative right straight leg raise test and negative left straight leg raise test. No scoliosis.     Right lower leg: No edema.     Left lower leg: No edema.     Comments: No step off deformity No pain with palpation of spine Reproducible tenderness to R lumbar muscles, including piriformis FROM of back and legs Able to walk on tip toes and heels. Normal pulses and sensation   Lymphadenopathy:     Cervical: No cervical adenopathy.  Skin:    General: Skin is warm and dry.     Findings: No erythema or rash.  Neurological:     General: No focal deficit present.     Mental Status: Kimberly Richard is alert and oriented to person, place, and time.     Sensory: No sensory deficit.     Motor: No weakness.     Coordination: Coordination normal.     Gait: Gait normal.     Deep Tendon Reflexes: Reflexes normal.  Psychiatric:        Mood and Affect: Mood normal.        Behavior: Behavior normal.      UC Treatments / Results  Labs (all labs ordered are listed, but only abnormal results are displayed) Labs Reviewed - No data to display  EKG   Radiology No results found.  Procedures Procedures (including critical care time)  Medications Ordered in UC Medications - No data to display  Initial Impression / Assessment and Plan / UC Course  I have reviewed the triage vital signs and the nursing notes.  Pertinent labs & imaging results that were available during my care of the patient were reviewed by me and considered in my medical decision making (see chart for details).     Muscle spasm -we  will start metaxalone to aid with muscle spasms and acute pain.  Patient understands side effect profile.  Take 3 times daily as needed. Piriformis syndrome on right-we will add NSAID to help with swelling and inflammation.  Rehab exercises discussed in depth.  Follow-up with physical therapy should symptoms persist.   Final Clinical Impressions(s) / UC Diagnoses   Final diagnoses:  Muscle spasm of back  Piriformis syndrome of right side     Discharge Instructions      Please start taking the muscle relaxer up to 3 times daily as tolerated.  Common side effects include sedation and drowsiness. Take the anti-inflammatory (diclofenac) up to twice daily with food.  Do not take any additional over-the-counter anti-inflammatory medications such as Motrin, Advil, Aleve, naproxen.  You may only take Tylenol if needed. Continue moist heat to your back, or a TENS unit. Do the attached rehab exercises. Follow-up with physical therapy should symptoms persist.     ED Prescriptions     Medication Sig Dispense Auth. Provider   metaxalone (SKELAXIN) 800 MG  tablet  (Status: Discontinued) Take 1 tablet (800 mg total) by mouth 3 (three) times daily for 10 days. 21 tablet Omar Gayden L, PA   diclofenac (VOLTAREN) 75 MG EC tablet  (Status: Discontinued) Take 1 tablet (75 mg total) by mouth 2 (two) times daily with a meal. 20 tablet Dariya Gainer L, PA   metaxalone (SKELAXIN) 800 MG tablet Take 1 tablet (800 mg total) by mouth 3 (three) times daily for 10 days. 30 tablet Teagyn Fishel L, PA   diclofenac (VOLTAREN) 75 MG EC tablet Take 1 tablet (75 mg total) by mouth 2 (two) times daily with a meal. 20 tablet Syrenity Klepacki L, PA      PDMP not reviewed this encounter.   Maretta Bees, [place] 10/21/21 1747
